# Patient Record
Sex: Female | Born: 1990 | Race: White | Hispanic: No | Marital: Single
Health system: Southern US, Community
[De-identification: ages and names within clinical notes are randomized; demographics above are authoritative.]

---

## 2010-04-14 ENCOUNTER — Ambulatory Visit: Payer: Self-pay | Admitting: Family Medicine

## 2010-04-22 ENCOUNTER — Ambulatory Visit: Payer: Self-pay | Admitting: Family Medicine

## 2010-09-27 ENCOUNTER — Ambulatory Visit: Payer: Self-pay | Admitting: Family Medicine

## 2010-12-02 ENCOUNTER — Ambulatory Visit: Payer: Self-pay | Admitting: Family Medicine

## 2011-01-20 ENCOUNTER — Ambulatory Visit: Payer: Self-pay | Admitting: Internal Medicine

## 2011-02-13 ENCOUNTER — Ambulatory Visit: Payer: Self-pay | Admitting: Family Medicine

## 2011-03-02 ENCOUNTER — Ambulatory Visit: Payer: Self-pay | Admitting: Family Medicine

## 2011-03-08 ENCOUNTER — Ambulatory Visit: Payer: Self-pay

## 2011-03-08 LAB — BASIC METABOLIC PANEL
BUN: 14 mg/dL (ref 7–18)
Calcium, Total: 9 mg/dL (ref 8.5–10.1)
Creatinine: 0.83 mg/dL (ref 0.60–1.30)
EGFR (African American): >60
EGFR (Non-African Amer.): >60
Osmolality: 276 (ref 275–301)
Potassium: 3.5 mmol/L (ref 3.5–5.1)
Sodium: 138 mmol/L (ref 136–145)

## 2011-03-08 LAB — CBC WITH DIFFERENTIAL/PLATELET
Eosinophil #: 0 10*3/uL (ref 0.0–0.7)
Lymphocyte #: 0.7 10*3/uL — ABNORMAL LOW (ref 1.0–3.6)
MCH: 32.2 pg (ref 26.0–34.0)
MCHC: 33.8 g/dL (ref 32.0–36.0)
Monocyte #: 0.2 10*3/uL (ref 0.0–0.7)
Neutrophil #: 5.7 10*3/uL (ref 1.4–6.5)
Neutrophil %: 85.5 %
Platelet: 215 10*3/uL (ref 150–440)
RBC: 4.72 10*6/uL (ref 3.80–5.20)

## 2011-03-08 LAB — URINALYSIS, COMPLETE
Leukocyte Esterase: NEGATIVE
Nitrite: NEGATIVE
Ph: 7.5 (ref 4.5–8.0)
Protein: NEGATIVE

## 2011-03-08 LAB — LIPASE, BLOOD: Lipase: 92 U/L (ref 73–393)

## 2011-03-08 LAB — PREGNANCY, URINE: Pregnancy Test, Urine: NEGATIVE m[IU]/mL

## 2011-03-10 LAB — URINE CULTURE

## 2011-04-18 ENCOUNTER — Ambulatory Visit: Payer: Self-pay | Admitting: Family Medicine

## 2011-05-09 ENCOUNTER — Ambulatory Visit: Payer: Self-pay | Admitting: Family Medicine

## 2011-06-24 ENCOUNTER — Ambulatory Visit: Payer: Self-pay | Admitting: Family Medicine

## 2012-03-19 IMAGING — CR RIGHT HAND - COMPLETE 3+ VIEW
1 series · 3 of 3 positions shown · non-contrast
Comparison: none

REASON FOR EXAM: right hand injury [employer] Pain swelling
injured 5wks ago
COMMENTS:

PROCEDURE:     KDR - KDXR HAND RT COMPLETE W/OBLIQUES  - April 18, 2011  [DATE]
RESULT:     Comparison: None.

[Series 1: pa · 0.17mm/px · 3 of 3 slices shown]
[im 1/3]
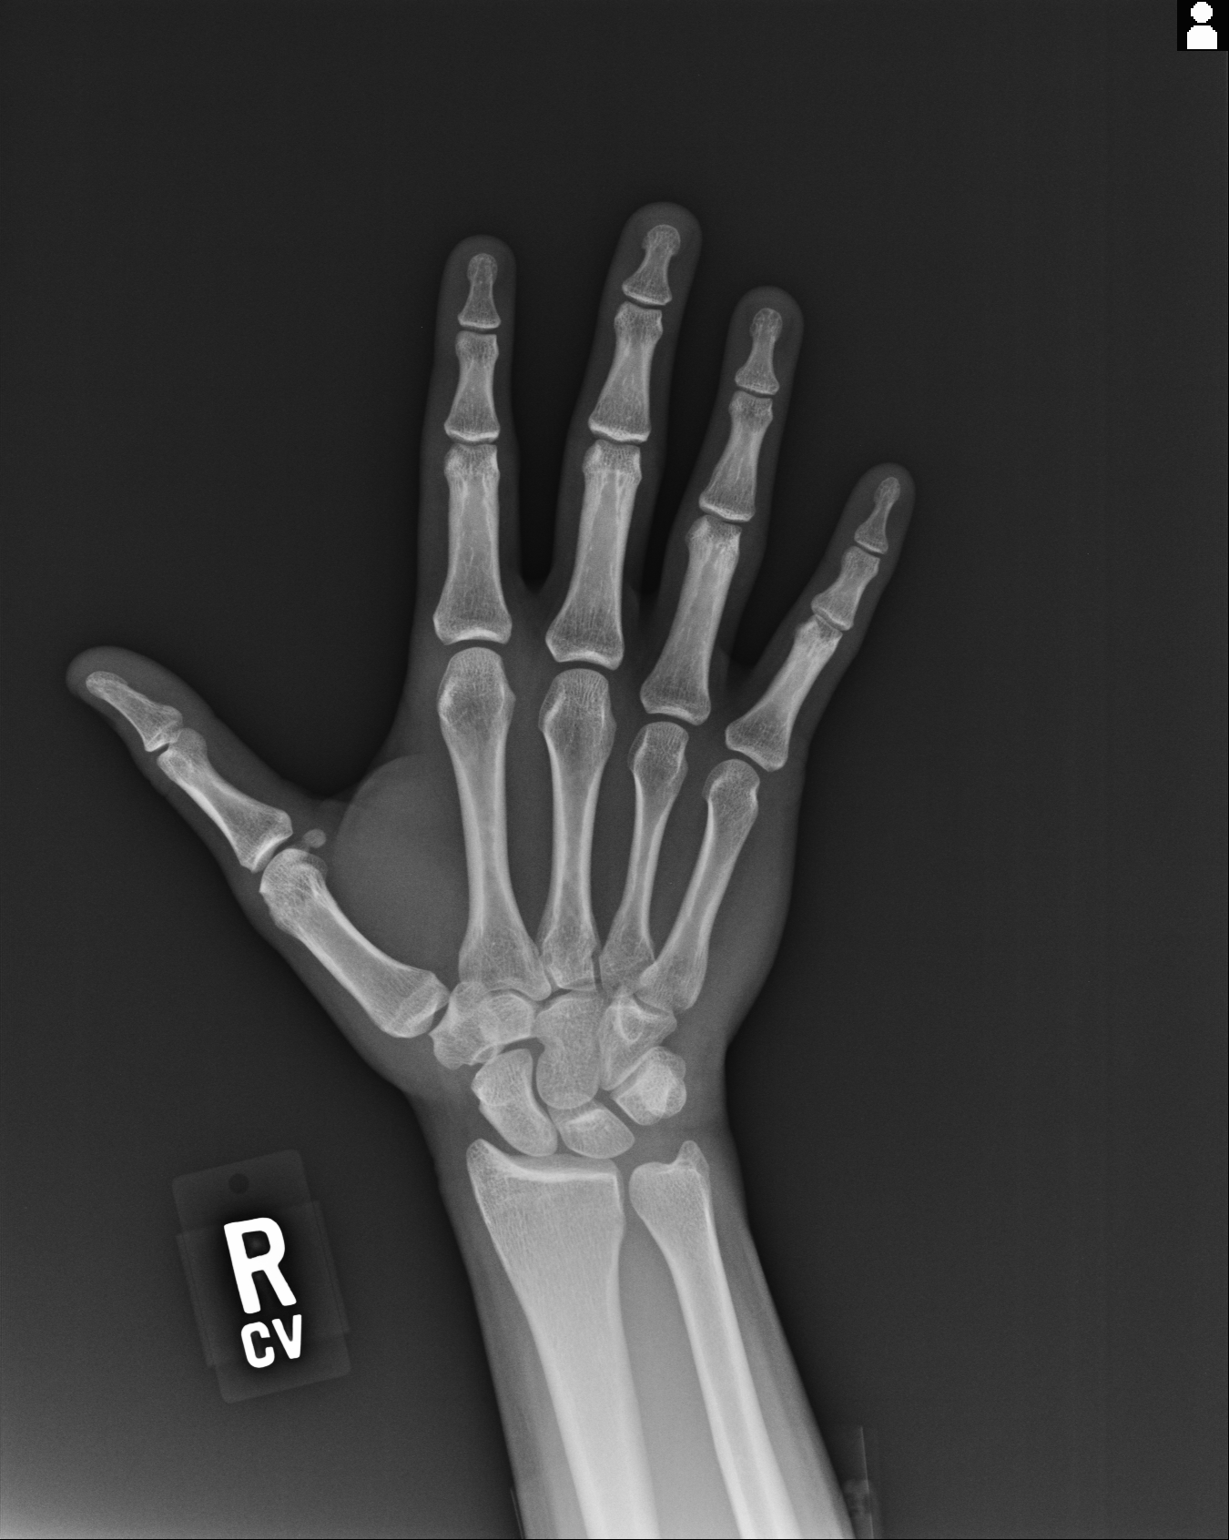
[im 2/3]
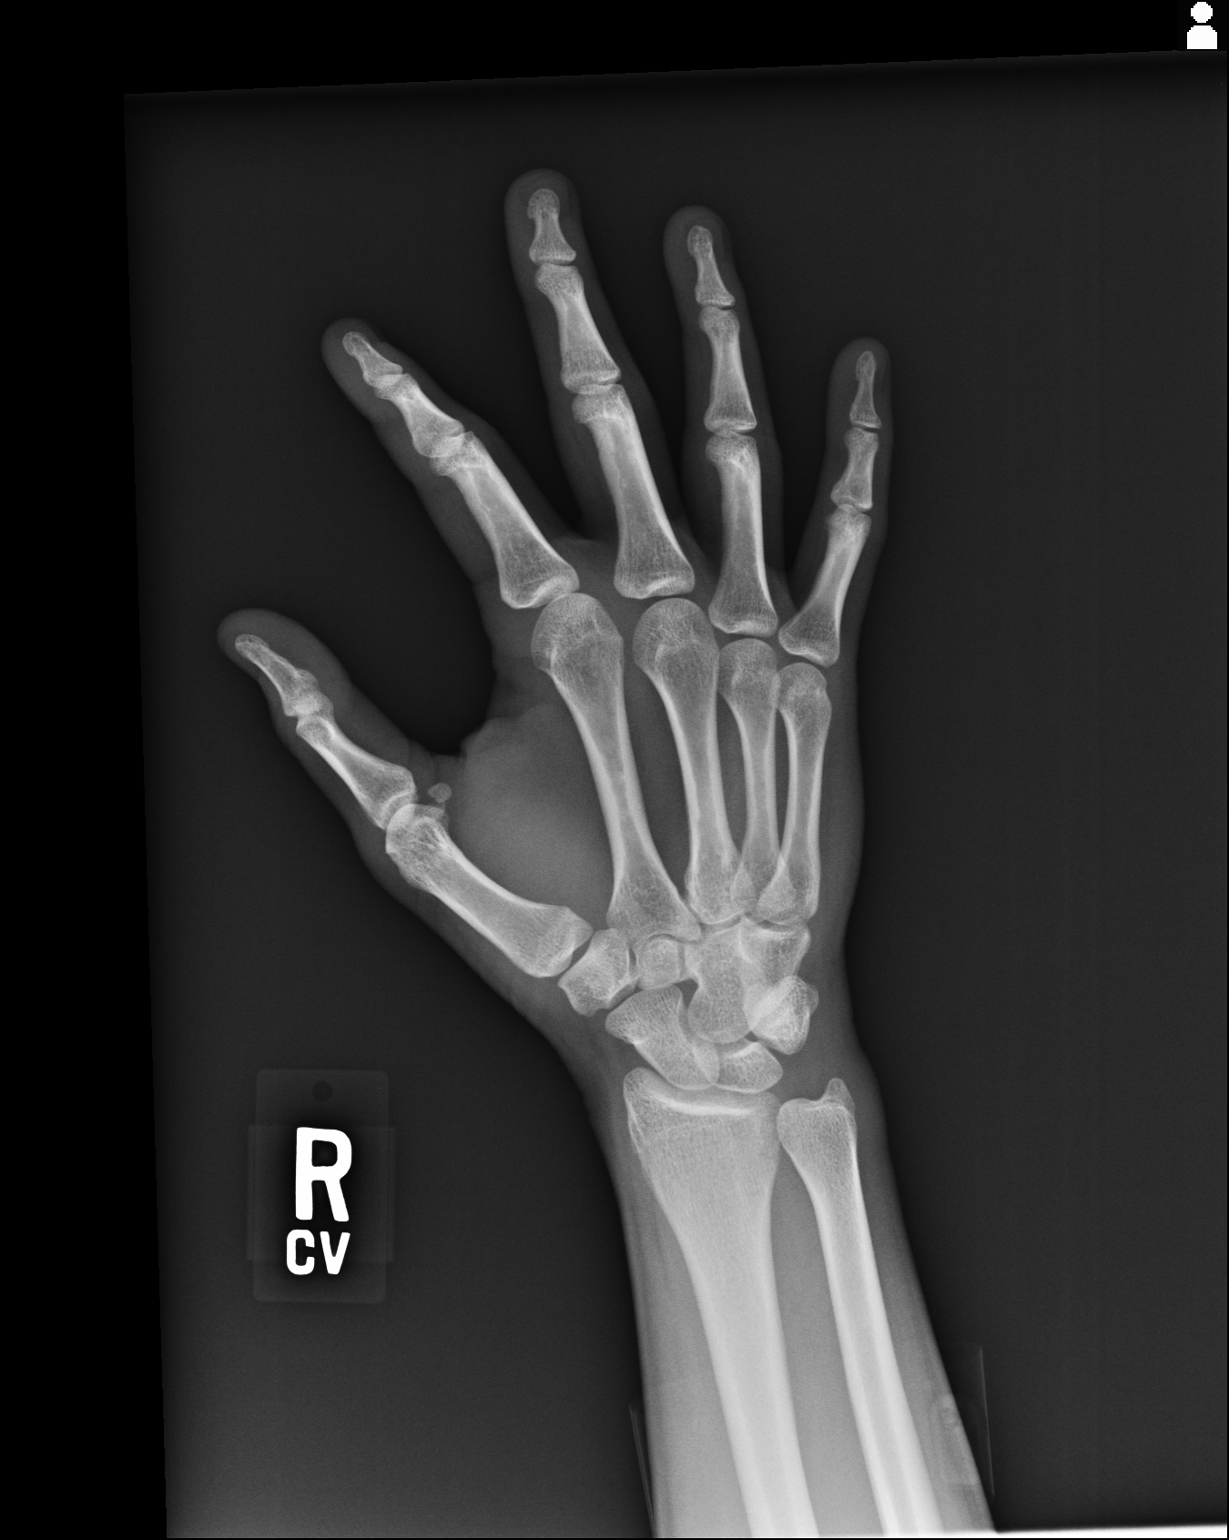
[im 3/3]
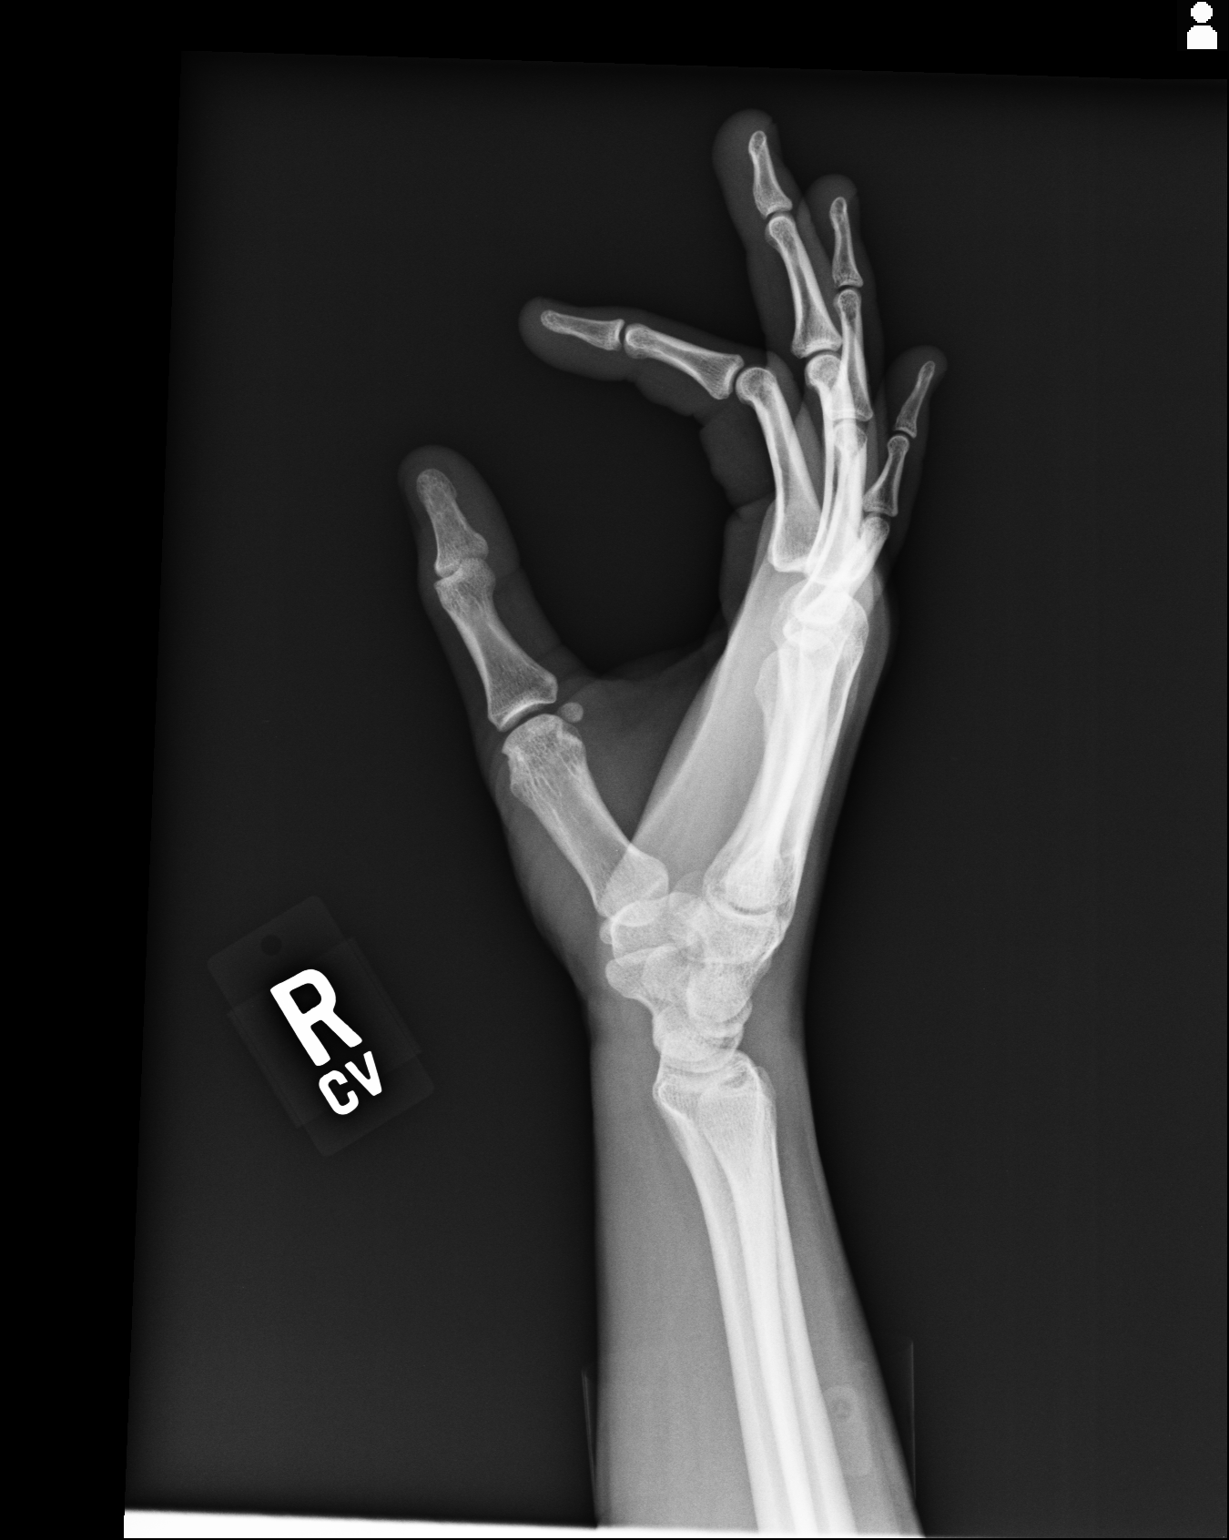

[3 of 3 positions shown; findings below may reference images not displayed]

FINDINGS: No acute fracture. Normal alignment. Joint spaces are maintained.
IMPRESSION: No acute findings.

## 2012-04-25 ENCOUNTER — Emergency Department: Payer: Self-pay | Admitting: Emergency Medicine

## 2012-04-26 ENCOUNTER — Ambulatory Visit: Payer: Self-pay | Admitting: Family Medicine

## 2012-05-30 ENCOUNTER — Ambulatory Visit: Payer: Self-pay | Admitting: Family Medicine

## 2012-07-02 ENCOUNTER — Ambulatory Visit: Payer: Self-pay | Admitting: Family Medicine

## 2012-07-18 ENCOUNTER — Ambulatory Visit: Payer: Self-pay | Admitting: Otolaryngology

## 2012-07-19 LAB — PATHOLOGY REPORT

## 2014-05-29 NOTE — Op Note (Signed)
PATIENT NAME:  Mary George, Mary George MR#:  [id number] DATE OF BIRTH:  October 20, 1990  DATE OF PROCEDURE:  07/18/2012  PREOPERATIVE DIAGNOSES:   1.  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  2.  Chronic ethmoid and maxillary sinusitis.  POSTOPERATIVE DIAGNOSES:   1.  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  2.  Chronic ethmoid and maxillary sinusitis.   PROCEDURES: 1.  Reconstructive septorhinoplasty.  2.  Bilateral submucous resection of the inferior turbinates.  3.  Image-guided sinus surgery.  4.  Bilateral anterior and posterior ethmoidectomy with tissue removal.  5.  Bilateral maxillary sinus antrostomies with tissue removal.   SURGEON:  Gertie Baron, M.D.   DESCRIPTION OF PROCEDURE:  The image-guided sinus surgery system mask was attached and registration was carried out in the standard fashion using the appropriate fiduciary points. Calibration of the system was confirmed and extensive review of the CT scan in all three dimensions preoperatively and intraoperatively was carried out.  Each instrument was registered and confirmed for anatomic accuracy.  Attention was directed to the ethmoid and maxillary sinus surgery, beginning on the patient's left side.  The anterior and posterior attachments of the middle turbinate was injected with local.  Pediatric side-biting thrucut instrumentation was used to take the uncinate process down comepletely, revealing the natural maxillary sinus ostium, which was progressively enlarged using additional thrucut instrumentation.  The ethmoid sinuses were then taken down with maximal mucosal preservation from anterior to posterior, carefully preserving the lamina papricia and skull  base. The ethmoid and maxillary cavities were then irrigated copiously with saline and suctioned completely clear.  The reconstructive septorhinoplasty was performed as an open approach using an inverted V transcolumellar incision which was completed with an 11 blade and 15 blade at the marginal incision.  A subnasal SMAS plane was then elevated over the tip cartilages and middle third of the nose.  A subperiosteal plane was elevated over the bony dorsum.  Once this had been accomplished the septum was approached through the anterior septal angle.  It was significantly caudally deviated rightward and the caudal deflection was resected and used as a small columellar strut later in the procedure.  The deformed portions of the cartilage were resected and this allowed for the septum to approach midline.  The upper lateral cartilages were then disarticulated from the septum and medial osteotomies were completed.  The septum was able to be midline at that point and a quilting 4-0 chromic suture was placed in the usual fashion.  The medial crura were then suture secured with the strut using 5-0 PDS suture.  The cephalic margin of the right lower lateral cartilage was transposed because of paradoxical concavity.  At this point, the upper lateral cartilages were advanced over the dorsal nasal septum and using 5-0 PDS as flaring sutures the upper lateral cartilages were reapproximated.  At this point, the skin SMAS envelope was reapproximated with 7-0 nylon and 5-0 chromic sutures.  Lateral osteotomies were completed.  The inferior turbinates were addressed first on the left with an incision with a 15 blade along the inferior margin.  The medial mucoperiosteum was elevated with a Cottle.  The lateral mucoperiosteum and conchal bone were resected with Knight scissors.  An identical procedure was performed on the right.  The remaining Neo turbinate was lateralized with  a Bouie elevator and cauterized along its inferior  margin.  Surgiflo was placed.  Temporary Telfa pledgets were placed and the nose was stabilized with Mastisol, half-inch paper tape and Aquaplast dressing.  The patient was returned to anesthesia, allowed to emerge from anesthesia in the operating room and taken to the recovery room in stable condition.  There were no complications.  Estimated blood loss 20 mL.     ____________________________ J. Gertie BaronMadison Mikisha Roseland, MD [email or web address] D: 07/18/2012 22:09:18 ET T: 07/18/2012 23:46:19 ET JOB#: [id number]  cc: Mary BarefootJ. Mary Itali Mckendry, MD, <Dictator> Mary CoppJMADISON Arkeem Harts MD ELECTRONICALLY SIGNED 07/31/2012 7:55
